# Patient Record
Sex: Male | Born: 1970 | Race: Black or African American | Hispanic: No | Marital: Single | Smoking: Current every day smoker
Health system: Southern US, Community
[De-identification: ages and names within clinical notes are randomized; demographics above are authoritative.]

## PROBLEM LIST (undated history)

## (undated) DIAGNOSIS — F329 Major depressive disorder, single episode, unspecified: Secondary | ICD-10-CM

## (undated) DIAGNOSIS — F32A Depression, unspecified: Secondary | ICD-10-CM

## (undated) DIAGNOSIS — F419 Anxiety disorder, unspecified: Secondary | ICD-10-CM

## (undated) HISTORY — PX: LEG SURGERY: SHX1003

---

## 2005-01-13 ENCOUNTER — Emergency Department (HOSPITAL_COMMUNITY): Admission: EM | Admit: 2005-01-13 | Discharge: 2005-01-14 | Payer: Self-pay | Admitting: Emergency Medicine

## 2005-03-18 ENCOUNTER — Emergency Department (HOSPITAL_COMMUNITY): Admission: EM | Admit: 2005-03-18 | Discharge: 2005-03-19 | Payer: Self-pay | Admitting: Emergency Medicine

## 2005-03-19 ENCOUNTER — Ambulatory Visit: Payer: Self-pay | Admitting: Psychiatry

## 2005-03-19 ENCOUNTER — Inpatient Hospital Stay (HOSPITAL_COMMUNITY): Admission: EM | Admit: 2005-03-19 | Discharge: 2005-03-22 | Payer: Self-pay | Admitting: Psychiatry

## 2005-03-26 ENCOUNTER — Emergency Department (HOSPITAL_COMMUNITY): Admission: EM | Admit: 2005-03-26 | Discharge: 2005-03-26 | Payer: Self-pay | Admitting: Emergency Medicine

## 2005-03-28 ENCOUNTER — Emergency Department (HOSPITAL_COMMUNITY): Admission: EM | Admit: 2005-03-28 | Discharge: 2005-03-28 | Payer: Self-pay | Admitting: Emergency Medicine

## 2005-03-30 ENCOUNTER — Emergency Department (HOSPITAL_COMMUNITY): Admission: EM | Admit: 2005-03-30 | Discharge: 2005-03-30 | Payer: Self-pay | Admitting: Emergency Medicine

## 2005-10-11 ENCOUNTER — Inpatient Hospital Stay (HOSPITAL_COMMUNITY): Admission: EM | Admit: 2005-10-11 | Discharge: 2005-10-12 | Payer: Self-pay | Admitting: Emergency Medicine

## 2005-10-11 ENCOUNTER — Encounter (INDEPENDENT_AMBULATORY_CARE_PROVIDER_SITE_OTHER): Payer: Self-pay | Admitting: Cardiovascular Disease

## 2005-10-12 ENCOUNTER — Ambulatory Visit: Payer: Self-pay | Admitting: Psychiatry

## 2005-10-12 ENCOUNTER — Inpatient Hospital Stay (HOSPITAL_COMMUNITY): Admission: RE | Admit: 2005-10-12 | Discharge: 2005-10-17 | Payer: Self-pay | Admitting: Psychiatry

## 2007-03-30 ENCOUNTER — Emergency Department (HOSPITAL_COMMUNITY): Admission: EM | Admit: 2007-03-30 | Discharge: 2007-03-30 | Payer: Self-pay | Admitting: Emergency Medicine

## 2007-08-01 ENCOUNTER — Ambulatory Visit: Payer: Self-pay | Admitting: Internal Medicine

## 2007-08-01 ENCOUNTER — Inpatient Hospital Stay (HOSPITAL_COMMUNITY): Admission: EM | Admit: 2007-08-01 | Discharge: 2007-08-02 | Payer: Self-pay | Admitting: Emergency Medicine

## 2007-08-02 ENCOUNTER — Encounter (INDEPENDENT_AMBULATORY_CARE_PROVIDER_SITE_OTHER): Payer: Self-pay | Admitting: Internal Medicine

## 2007-09-04 ENCOUNTER — Ambulatory Visit: Payer: Self-pay | Admitting: *Deleted

## 2007-09-04 ENCOUNTER — Encounter (INDEPENDENT_AMBULATORY_CARE_PROVIDER_SITE_OTHER): Payer: Self-pay | Admitting: *Deleted

## 2007-09-04 DIAGNOSIS — F17200 Nicotine dependence, unspecified, uncomplicated: Secondary | ICD-10-CM

## 2007-09-04 DIAGNOSIS — F1010 Alcohol abuse, uncomplicated: Secondary | ICD-10-CM | POA: Insufficient documentation

## 2007-09-04 DIAGNOSIS — F329 Major depressive disorder, single episode, unspecified: Secondary | ICD-10-CM | POA: Insufficient documentation

## 2007-09-04 DIAGNOSIS — R079 Chest pain, unspecified: Secondary | ICD-10-CM | POA: Insufficient documentation

## 2007-09-04 DIAGNOSIS — F1410 Cocaine abuse, uncomplicated: Secondary | ICD-10-CM | POA: Insufficient documentation

## 2007-09-04 LAB — CONVERTED CEMR LAB: TSH: 0.873 microintl units/mL (ref 0.350–4.50)

## 2007-09-12 ENCOUNTER — Telehealth (INDEPENDENT_AMBULATORY_CARE_PROVIDER_SITE_OTHER): Payer: Self-pay | Admitting: *Deleted

## 2007-09-26 ENCOUNTER — Ambulatory Visit: Payer: Self-pay | Admitting: Internal Medicine

## 2007-09-26 DIAGNOSIS — F411 Generalized anxiety disorder: Secondary | ICD-10-CM | POA: Insufficient documentation

## 2007-11-14 ENCOUNTER — Telehealth: Payer: Self-pay | Admitting: Internal Medicine

## 2007-11-29 ENCOUNTER — Emergency Department (HOSPITAL_COMMUNITY): Admission: EM | Admit: 2007-11-29 | Discharge: 2007-11-29 | Payer: Self-pay | Admitting: Emergency Medicine

## 2007-11-29 ENCOUNTER — Ambulatory Visit: Payer: Self-pay | Admitting: Psychiatry

## 2007-11-29 ENCOUNTER — Inpatient Hospital Stay (HOSPITAL_COMMUNITY): Admission: EM | Admit: 2007-11-29 | Discharge: 2007-11-29 | Payer: Self-pay | Admitting: Psychiatry

## 2007-12-23 ENCOUNTER — Encounter (INDEPENDENT_AMBULATORY_CARE_PROVIDER_SITE_OTHER): Payer: Self-pay | Admitting: *Deleted

## 2010-01-01 ENCOUNTER — Emergency Department (HOSPITAL_COMMUNITY): Admission: EM | Admit: 2010-01-01 | Discharge: 2010-01-01 | Payer: Self-pay | Admitting: Emergency Medicine

## 2010-01-04 ENCOUNTER — Emergency Department (HOSPITAL_COMMUNITY): Admission: EM | Admit: 2010-01-04 | Discharge: 2010-01-04 | Payer: Self-pay | Admitting: Emergency Medicine

## 2010-05-17 LAB — DIFFERENTIAL
Basophils Absolute: 0 10*3/uL (ref 0.0–0.1)
Basophils Relative: 0 % (ref 0–1)
Lymphocytes Relative: 27 % (ref 12–46)
Neutro Abs: 3.4 10*3/uL (ref 1.7–7.7)
Neutrophils Relative %: 60 % (ref 43–77)

## 2010-05-17 LAB — BASIC METABOLIC PANEL
BUN: 14 mg/dL (ref 6–23)
Calcium: 9.2 mg/dL (ref 8.4–10.5)
GFR calc non Af Amer: 60 mL/min — ABNORMAL LOW (ref >60)
Glucose, Bld: 98 mg/dL (ref 70–99)
Sodium: 137 mEq/L (ref 135–145)

## 2010-05-17 LAB — FECAL LACTOFERRIN, QUANT

## 2010-05-17 LAB — STOOL CULTURE

## 2010-05-17 LAB — GIARDIA/CRYPTOSPORIDIUM SCREEN(EIA): Giardia Screen - EIA: NEGATIVE

## 2010-05-17 LAB — CLOSTRIDIUM DIFFICILE EIA

## 2010-05-17 LAB — HEMOCCULT GUIAC POC 1CARD (OFFICE): Fecal Occult Bld: POSITIVE

## 2010-05-17 LAB — CBC
MCHC: 34.3 g/dL (ref 30.0–36.0)
RDW: 12.4 % (ref 11.5–15.5)
WBC: 5.7 10*3/uL (ref 4.0–10.5)

## 2010-05-18 LAB — COMPREHENSIVE METABOLIC PANEL
ALT: 24 U/L (ref 0–53)
AST: 23 U/L (ref 0–37)
CO2: 24 mEq/L (ref 19–32)
Chloride: 104 mEq/L (ref 96–112)
Creatinine, Ser: 1.21 mg/dL (ref 0.4–1.5)
GFR calc Af Amer: >60 mL/min (ref >60)
GFR calc non Af Amer: >60 mL/min (ref >60)
Sodium: 136 mEq/L (ref 135–145)
Total Bilirubin: 0.6 mg/dL (ref 0.3–1.2)

## 2010-05-18 LAB — DIFFERENTIAL
Basophils Absolute: 0 10*3/uL (ref 0.0–0.1)
Basophils Relative: 0 % (ref 0–1)
Eosinophils Absolute: 0 10*3/uL (ref 0.0–0.7)
Eosinophils Relative: 0 % (ref 0–5)

## 2010-05-18 LAB — CBC
Hemoglobin: 15.6 g/dL (ref 13.0–17.0)
MCH: 31.6 pg (ref 26.0–34.0)
RBC: 4.94 MIL/uL (ref 4.22–5.81)

## 2010-05-18 LAB — URINALYSIS, ROUTINE W REFLEX MICROSCOPIC
Bilirubin Urine: NEGATIVE
Glucose, UA: NEGATIVE mg/dL
Hgb urine dipstick: NEGATIVE
Protein, ur: 30 mg/dL — AB

## 2010-05-18 LAB — LIPASE, BLOOD: Lipase: 21 U/L (ref 11–59)

## 2010-05-18 LAB — URINE MICROSCOPIC-ADD ON

## 2010-07-22 NOTE — Discharge Summary (Signed)
NAMESILVERIO, HAGAN NO.:  [id number]   MEDICAL RECORD NO.:  [id number]          PATIENT TYPE:  IPS   LOCATION:  0501                          FACILITY:  BH   PHYSICIAN:  Geoffery Lyons, M.D.      DATE OF BIRTH:  [birth year]   DATE OF ADMISSION:  11/29/2007  DATE OF DISCHARGE:  11/29/2007                               DISCHARGE SUMMARY   CHIEF COMPLAINT AND HISTORY OF PRESENT ILLNESS:  This was the first  admission to [facility] for this 40 year old male,  initially went into the ED because he endorsed he was having suicidal  thoughts.  Has been upset recently, had a series of negative triggers,  unemployment, financial difficulties, he had a heart attack about 18  months prior to this admission, admitted to suicidal ruminations but  endorsed that he would not do it.  He was placed on a medication.  He  claims that he was told that if he has any thoughts of suicide just to  come to the ED.  He felt that everything that was going on was secondary  to the medication he took.  Nevertheless, was agreeable to checking for  observation and assessment.   SUBSTANCE ABUSE HISTORY:  No active treatment.  According to history,  did endorse prior history of substance use, cocaine starting age 23,  using every two or three months that went on for three years except  November 28, 2007 that he used 1/2 gram, alcohol starting at 11, last  used four months prior to this admission and marijuana starting at 63,  last use five months prior to this admission.   PAST PSYCHIATRIC HISTORY:  Was at [facility], [facility] and had some follow  up at [facility] and he was apparently dropped  secondary to substance abuse.   PAST MEDICAL HISTORY:  Positive for coronary artery disease, status post  MI.   MEDICATION:  He was on BuSpar 5 mg twice a day.   PHYSICAL EXAMINATION:  Failed to show any acute findings.   LABORATORY WORK:  Not available in the  chart.   MENTAL STATUS EXAM:  Alert, cooperative male who endorsed he was upset.  The feeling that he was feeling was secondary to the medication.  Was  endorsing no active suicidal or homicidal ideas.  Upon this assessment  he felt he was ready to go home.  They had some child care issues.  Endorsed he was not going to hurt himself, was willing to pursue  outpatient follow up.  No active suicidal or homicidal ideas.  No  delusions.  No hallucinations.  Cognition well-preserved.   DIAGNOSIS:  AXIS I:  1. 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  2. Depressed disorder, not otherwise specified.  AXIS II:  No diagnosis.  AXIS III:  Coronary artery disease with myocardial infarction by history.  AXIS IV:  Moderate.  AXIS V:  On admission 45-50, high global assessment of functioning in the last  year 60.   COURSE IN THE HOSPITAL:  Was admitted.  He was  insistent in going home.  He was denying any active suicidal or homicidal ideas.  He felt that it  was his response secondary to the medication he was given.  Had some  cocaine after four months abstinence, but was upsetting to him.  His  girlfriend was contacted and she was able to validate the fact that she  did not feel that he was going to be a danger to himself.  They did have  some child care issues.  He was willing to pursue outpatient treatment.  She was in agreement of letting him go from the hospital.  She had no  safety concerns.   DISCHARGE DIAGNOSIS:  AXIS I:  1. Depressive disorder, not otherwise specified.  2. 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  AXIS II:  No diagnosis.  AXIS III:  Status post myocardial infarction by history.  AXIS IV:  Moderate.  AXIS V:  Upon discharge 50-55.   Discharged to follow up in psychology clinic at [facility].      Geoffery Lyons, M.D.  Electronically Signed     IL/MEDQ  D:  12/17/2007  T:  12/18/2007  Job:  [id number]

## 2010-07-22 NOTE — Discharge Summary (Signed)
[name], CHENIER NO.:  [id number]   MEDICAL RECORD NO.:  [id number]          PATIENT TYPE:  IPS   LOCATION:  0304                          FACILITY:  BH   PHYSICIAN:  Jeanice Lim, M.D. DATE OF BIRTH:  06/30/1970   DATE OF ADMISSION:  03/19/2005  DATE OF DISCHARGE:  03/22/2005                                 DISCHARGE SUMMARY   IDENTIFYING DATA:  This is a 40 year old single African-American male  voluntarily admitted.  Presenting with a history of substance abuse, tired  of alcohol and drug use.  Out of control, using heroin, crack and alcohol  daily, 40-ounce beers 2-3.  Describing impaired sleep.  Denied IV drug use.  Had not slept in several days prior to admission.  [facility] admission.  No family psychiatric history.   ALCOHOL/DRUG HISTORY:  As above.   PRIMARY CARE PHYSICIAN:  None.   MEDICAL HISTORY:  No medical problems reported.   MEDICATIONS:  None.   ALLERGIES:  No known drug allergies.   PHYSICAL EXAMINATION:  Physical and neurologic exam performed at [facility]  and essentially within normal limits.   MENTAL STATUS EXAM:  Sleepy, oriented, in bed, poor eye contact, soft-  spoken, tired, coherent with no evidence of psychosis.  Cognitively intact.  Judgment and insight were impaired.  Poor historian.  Poor impulse control  history.   ADMISSION DIAGNOSES:  AXIS I:  Polysubstance dependence.  Rule out substance-  induced mood disorder.  AXIS II:  Deferred.  AXIS III:  None.  AXIS IV:  Moderate (problems with housing, other psychosocial issues and  sequelae of substance use).  AXIS V:  35/55.   HOSPITAL COURSE:  The patient was admitted and ordered routine p.r.n.  medications and underwent further monitoring.  Was encouraged to participate  in individual, group and milieu therapy.  Was placed on a detox protocol  with [facility].  The patient participated in dual-diagnosis therapy.  Worked on  a relapse  prevention plan.  Identifying triggers.  Reported a positive  response to medication changes and crisis interventions as well as  tolerating detox without complications.   CONDITION ON DISCHARGE:  Discharged in improved condition with no withdrawal  symptoms, no dangerous ideation.  Mood improved.  Future-oriented problem-  solving.  Improved judgment and insight.  Reporting motivation to remain  compliant with the aftercare plan, medications and abstinence.  The patient  was given medication education at the time of discharge.   DISCHARGE MEDICATIONS:  1.  Symmetrel 100 mg b.i.d. p.r.n. cravings.  2.  Trazodone 100 mg at 8 p.m.   FOLLOW UP:  The patient was to follow up with [facility] on March 31, 2005 at 12:30 p.m.   DISCHARGE DIAGNOSES:  AXIS I:  Polysubstance dependence.  Rule out substance-  induced mood disorder.  AXIS II:  Deferred.  AXIS III:  None.  AXIS IV:  Moderate (problems with housing, other psychosocial issues and  sequelae of substance use).  AXIS V:  GAF on discharge 55.  Jeanice Lim, M.D.  Electronically Signed     JEM/MEDQ  D:  04/21/2005  T:  04/22/2005  Job:  [id number]

## 2010-07-22 NOTE — Discharge Summary (Signed)
[name], [name] NO.:  [id number]   MEDICAL RECORD NO.:  [id number]          PATIENT TYPE:  INP   LOCATION:  3740                         FACILITY:  [facility]   PHYSICIAN:  Alvester Morin, M.D.  DATE OF BIRTH:  1970/07/04   DATE OF ADMISSION:  08/01/2007  DATE OF DISCHARGE:  08/02/2007                               DISCHARGE SUMMARY   DISCHARGE DIAGNOSES:  1. Chest pain related to recent cocaine abuse and likely coronary      vasospasm.  2. History of cocaine abuse.  3. History of remote heroin abuse.  4. History of alcohol abuse.  5. Two psych admissions for polysubstance abuse at [facility],      one in January 2007 and the next in August 2007.  6. History of tobacco abuse.   DISCHARGE MEDICATIONS:  None.   FOLLOWUP:  The patient will follow up in the Outpatient Clinic of  Internal Medicine at [facility].  He is to be followed up in regards to  his ability to remain sober from alcohol abuse in addition to abstaining  from cocaine and heroin abuse.  He also has to be followed up in regards  to his chest pain that he experienced secondary to his recent cocaine  use.   CONSULTATIONS:  None.   PROCEDURE PERFORMED:  1. Chest x-ray performed Aug 01, 2007, indicating chronic bronchitic      changes without any acute abnormality.  2. A 2-D echo performed Aug 02, 2007, indicating overall systolic      ejection fraction of 65%.  There are no wall motion abnormalities.   BRIEF ADMITTING HISTORY AND PHYSICAL:  This is a 37-year [place]  American male with a history of cocaine abuse, tobacco abuse, alcohol  abuse, and heroin abuse, who had a prior admission for cocaine related  chest pain and a cardiac catheterization performed 2007 that was free of  any coronary artery disease.  He comes in after having done cocaine  yesterday around 5 p.m.  He had significant chest pain, nausea,  vomiting, diaphoresis following cocaine abuse.  At that time noted that  he could exacerbate the pain both by walking and by sitting down for an  extended period of time.  He did have significant shortness of breath  with this.  As pain lasted throughout the night and into the morning, he  finally called EMS when he realized the pain would not subside on its  own.   PHYSICAL EXAMINATION:  ADMITTING VITALS:  Temperature 98.3, blood  pressure 114/77, pulse 73, respirations 18, and he is sating 98% on room  air.  GENERAL:  This is a healthy-appearing African American man in no acute  distress.  CARDIOVASCULAR:  Regular rate and rhythm.  No murmurs, rubs, or gallops.  PULMONARY:  Lungs are clear bilaterally without any wheezes or rhonchi.  ABDOMEN:  Soft, nontender, and nondistended.  Good bowel sounds.  NEUROLOGIC:  Nonfocal exam.  Cranial nerves II through XII intact.  EXTREMITIES:  Muscle strength is +5 in all extremities.  PSYCH:  Appropriate thought process and content.  No hallucinations  currently.  The patient has a normal affect and is normally interactive.   ADMITTING LABS:  Sodium 137, potassium 3.9, chloride 105, bicarb 26, BUN  12, creatinine 1.16, glucose 107, troponin 0.02, lipase 20, CK 220, and  MB 1.9.  Hemoglobin 14.1, white count 9.3, platelets 289, ANC 7.3, and  anion gap 14.   HOSPITAL COURSE:  1. Chest pain.  The patient had chest pain likely secondary to cocaine      abuse.  He likely had coronary vasospasm.  Esophageal spasm is also      a possibility.  We ruled the patient out for ACS with cardiac      enzymes x3 and EKGs were negative for any myocardial ischemia.  We      did give the patient nitroglycerin IV upon admission and I did      relieve his chest pain completely.  He also received aspirin and      Ativan for his recent cocaine abuse.  We offered him morphine      sulfate IV for chest pain as needed.  The patient realized that his      cocaine abuse was the cause of his recent chest pain.  He felt      remorse for taking  cocaine and he wants to quit using this drug.      We will try to help him abstain from cocaine abuse and see him in      the outpatient clinic to monitor his progress.  The patient has a      history of having a cardiac catheterization that was negative for      any coronary artery disease.  Therefore, we did not start any      aspirin or any other medications for him at this time.  2. Cocaine abuse.  The patient received counseling in the hospital.      He also received information from clinical social worker in regards      to resources in the [facility] community that may be able to      help him quit his drug abuse.  We will follow his progress up in      the outpatient clinic.  3. Alcohol abuse.  We offered thiamine and folate while in hospital.      We placed him on CO protocol, however, he did not withdraw while in      hospital.   DISCHARGE LABS:  TSH 0.833.   DISCHARGE VITALS:  Temperature 98.5, blood pressure 116/70, pulse 62,  respiration 18, and saturating 100% on room air.      Marcus Sails, MD  Electronically Signed      Alvester Morin, M.D.  Electronically Signed    CB/MEDQ  D:  08/07/2007  T:  08/08/2007  Job:  [id number]   cc:   [name]

## 2010-07-22 NOTE — Discharge Summary (Signed)
[name], [name] NO.:  [id number]   MEDICAL RECORD NO.:  [id number]          PATIENT TYPE:  IPS   LOCATION:  0508                          FACILITY:  [facility]   PHYSICIAN:  Geoffery Lyons, M.D.      DATE OF BIRTH:  May 09, 1970   DATE OF ADMISSION:  10/12/2005  DATE OF DISCHARGE:  10/17/2005                                 DISCHARGE SUMMARY   CHIEF COMPLAINT AND PRESENT ILLNESS:  This was the second admission to [facility] for this 40 year old African-American male  voluntarily admitted.  History of cocaine abuse.  Kicked out by girlfriend  due to his use.  Went to the ED complaining of chest pain.  Admitted.  Had a  workup that eventually turned out to be negative.  Claimed he had never  followed up after last admission.  Wanted inpatient rehab.  Reports  depression.  No suicidal or homicidal ideation.   PAST PSYCHIATRIC HISTORY:  Second time at [facility].  He was  admitted in January of 2007 for polysubstance dependence.   ALCOHOL/DRUG HISTORY:  Smokes.  Recent use of cocaine.   MEDICAL HISTORY:  Status post cardiac catheterization.   MEDICATIONS:  Norvasc 2.5 mg per day, Altace 2.5 mg per day, Prozac 10 mg  per day, trazodone 50 mg at bedtime.   PHYSICAL EXAMINATION:  Performed and failed to show any acute findings.   LABORATORY DATA:  Blood chemistry with sodium 137, potassium 5.0, glucose  120, BUN 34, creatinine 2.3, albumin 3.8.  SGOT 39, SGPT 26, bilirubin 0.7,  cholesterol 219.  Drug screen positive for cocaine.   MENTAL STATUS EXAM:  Upon admission revealed a male who was cooperative.  No  eye contact.  Speech clear, normal rate, tempo and production.  Mood tired.  Affect constricted.  Thought processes logical, coherent and relevant.  No  evidence of delusions.  No suicidal or homicidal ideation.  No  hallucinations.  Cognition was well-preserved.   ADMISSION DIAGNOSES:  AXIS I:  Depressive disorder not otherwise  specified.  Cocaine abuse.  AXIS II:  No diagnosis.  AXIS III:  Status post cardiac cath.  AXIS IV:  Moderate.  AXIS V:  GAF upon admission 35; highest GAF in the last year 60.   HOSPITAL COURSE:  He was admitted.  He was started in individual and group  psychotherapy.  He was given trazodone for sleep.  He was maintained on  Prozac 10 mg per day, Altace 2.5 mg per day, Norvasc 2.5 mg per day.  Prozac  was eventually increased to 20 mg.  We continued to increase the trazodone  until effective.  He endorsed he had been having a very hard time, having a  difficult time with cocaine.  Came to this area from [place] following his  girlfriend and their son.  He has not been able to stay abstinent.  Apparently, girlfriend gave up on him and asked him to leave.  He was  wanting to go to a longer term treatment facility.  Endorsed difficulty with  mood, depression.  We pursued  detox.  We worked on relapse prevention.  He  continued to have a hard time with depression, dealing with the cravings,  issues of shame, guilt, __________ himself as he felt he should have dealt  with the situation better.  Still wanting to go to a residential treatment  program.  Girlfriend was supportive.  Was wanting to maintain a relationship  with him if he was to seek out long-term sobriety.  We continued to work on  a relapse prevention plan.  We continued to look for residential treatment  programs but we were unable to find one.  Options were going to a shelter in  [place] and going to [facility] with hope of  getting a bed in the same residential program in the next several days or  weeks or going to a halfway house.  He was able, on October 17, 2005, to  secure a placement in a halfway house.  __________ disposition, he was going  to get himself to find a job and be able to support himself.  Meanwhile go  to outpatient treatment.   DISCHARGE DIAGNOSES:  AXIS I:  Cocaine abuse.   Depressive disorder not  otherwise specified.  AXIS II:  No diagnosis.  AXIS III:  Status post cardiac cath, arterial hypertension, rhinosinusitis.  AXIS IV:  Moderate.  AXIS V:  GAF upon discharge 50-55.   DISCHARGE MEDICATIONS:  1. Norvasc 2.5 mg per day.  2. Altace 2.5 mg per day.  3. Prozac 20 mg per day.  4. Trazodone 150 mg at bedtime.  5. Sudafed 60 mg twice a day.  6. Z-Pak.  7. __________ 600 mg twice a day.   FOLLOWUP:  To be arranged after he gets to the [facility].      Geoffery Lyons, M.D.  Electronically Signed     IL/MEDQ  D:  11/01/2005  T:  11/01/2005  Job:  [id number]

## 2010-07-22 NOTE — Discharge Summary (Signed)
[name], [name]                ACCOUNT NO.:  [id number]   MEDICAL RECORD NO.:  [id number]          PATIENT TYPE:  INP   LOCATION:  3707                         FACILITY:  [facility]   PHYSICIAN:  [name], M.D.  DATE OF BIRTH:  [birth year]   DATE OF ADMISSION:  10/10/2005  DATE OF DISCHARGE:  10/13/2005                                 DISCHARGE SUMMARY   PRINCIPAL DIAGNOSES:  1. Cocaine abuse.  2. Dehydration.  3. Suicidal ideation.  4. Abnormal electrocardiogram.  5. Abnormal cardiac enzymes.   DISCHARGE MEDICATIONS:  1. Norvasc 2.5 mg one daily.  2. Altace 2.5 mg one daily.  3. Prozac 10 mg one daily.  4. Trazodone 50 mg one at bedtime as needed.   CONDITION ON DISCHARGE:  Stable.   DISCHARGE ACTIVITIES:  As tolerated.   DISCHARGE DIET:  Low fat, low salt diet.   FOLLOW-UP:  By Dr. Jeanie Salinas (psychiatrist) as arranged.   HISTORY:  This 40 year old black male was kicked out by the girlfriend for  doing cocaine.  He came to the emergency room, was found to have abnormal  EKG and abnormal cardiac enzymes.  With a history of cocaine abuse he was  admitted.   PHYSICAL EXAMINATION:  VITAL SIGNS:  Pulse 89, respirations 17, blood  pressure 112/73, height 5 feet 6 inch, weight 175 pounds, oxygen saturation  98% on 2 L, temperature 98 degrees Fahrenheit.  GENERAL:  Patient averagely built, well-nourished.  HEENT:  Patient is normocephalic, atraumatic with brown eyes.  Pupils equal  and reactant to light.  Extraocular movement intact.  NECK:  Supple.  No JVD.  LUNGS:  Clear bilaterally.  HEART:  Normal S1, S2.  ABDOMEN:  Soft and nontender.  EXTREMITIES:  No edema, cyanosis, clubbing.  SKIN:  No rash.  CNS:  Cranial nerves grossly intact.  Patient moves all four extremities.   LABORATORY DATA:  Normal hemoglobin, hematocrit.  Normal WBC count, platelet  count.  Normal electrolytes.  BUN elevated at 34.  Creatinine elevated at  2.3.  Subsequent BUN/creatinine improved  to 21 and 1.   Chest x-ray:  No active cardiopulmonary process.   EKG:  Sinus rhythm with acute ST elevations in anterolateral leads  suggestive of acute MI.   Cardiac catheterization showed normal coronaries.   HOSPITAL COURSE:  Patient was admitted to telemetry unit due to abnormal EKG  and abnormal cardiac enzymes.  He underwent cardiac catheterization that  showed normal coronaries.  He received IV heparin.  He was placed on calcium  channel blocker, Norvasc 2.5 mg.  He was also placed on Altace 2.5 mg and he  was given IV fluids for his dehydration.  His beta blocker was discontinued  and he was discharged to [facility] in satisfactory condition  after psychiatric evaluation by Dr. Jeanie Salinas.  Since patient had expressed  suicidal ideation and help with cocaine use his need would be well served by  [facility] admission and by subsequent psychiatric follow-up  as needed.  Will be followed by me as needed also.      [name]  [name], M.D.  Electronically Signed     ASK/MEDQ  D:  10/12/2005  T:  10/12/2005  Job:  [id number]

## 2010-07-22 NOTE — Cardiovascular Report (Signed)
NAMEKIRKE, BREACH NO.:  [id number]   MEDICAL RECORD NO.:  [id number]          PATIENT TYPE:  INP   LOCATION:  1823                         FACILITY:  [facility]   PHYSICIAN:  Ricki Rodriguez, M.D.  DATE OF BIRTH:  1971/03/05   DATE OF PROCEDURE:  10/11/2005  DATE OF DISCHARGE:                              CARDIAC CATHETERIZATION   DATE OF PROCEDURE:  October 10, 2005   LOCATION:  Hospital location - emergency room.   PROCEDURE:  Left heart catheterization and selective coronary angiography.   INDICATIONS FOR PROCEDURE:  This 40 year old black male had significant  abnormality on his electrocardiogram, cardiac enzymes and cocaine use  without chest pain.   APPROACH:  Right femoral artery using #4 French sheath.   COMPLICATIONS:  None.   HEMODYNAMIC DATA:  1. The aortic pressure was 100/72 and left ventricular pressure was 97/15.  2. A left ventriculogram was not done to conserve dye use due to renal      dysfunction.  Only 35 cc of the dye was used.  3. Coronary anatomy.  The left coronary artery was short.  4. Left anterior descending coronary artery.  The left anterior descending      coronary artery was unremarkable.  5. Left circumflex coronary artery.  The left circumflex coronary artery      was unremarkable and it was a large vessel.  6. Right coronary artery.  The right coronary artery was dominant and      unremarkable.   IMPRESSION:  Normal coronaries.   RECOMMENDATIONS:  This patient will be treated medically.  He will also have  psychiatric consult or a drug/rehab consult.      Ricki Rodriguez, M.D.  Electronically Signed     ASK/MEDQ  D:  10/11/2005  T:  10/11/2005  Job:  [id number]

## 2010-07-22 NOTE — H&P (Signed)
[name], [name] NO.:  [id number]   MEDICAL RECORD NO.:  [id number]          PATIENT TYPE:  IPS   LOCATION:  0304                          FACILITY:  [facility]   PHYSICIAN:  Jeanice Lim, M.D. DATE OF BIRTH:  June 10, 1970   DATE OF ADMISSION:  03/19/2005  DATE OF DISCHARGE:  03/22/2005                         PSYCHIATRIC ADMISSION ASSESSMENT   IDENTIFYING INFORMATION:  A 40 year old single African-American male,  voluntarily admitted on March 19, 2005.   HISTORY OF PRESENT ILLNESS:  The patient presents with a history of  substance abuse.  States he is tired of the alcohol and drug use, has been  using heroin, crack and alcohol, drinking 2-3 40-ounce beers.  His last  drink was on Thursday.  Longest history of sobriety has been 2 years.  Sleeping has been decreased.  He denies any IV drug use.  He states he has  not slept in 2 days.  Denies any suicidal thoughts or psychotic symptoms.   PAST PSYCHIATRIC HISTORY:  First admission to [facility].   SOCIAL HISTORY:  He is a 40 year old single African-American male, lives  alone, considered homeless.  The patient works at the [employer],  has a 12th grade education.   FAMILY HISTORY:  None.   ALCOHOL DRUG HISTORY:  As stated above.   PAST MEDICAL HISTORY:  Primary care [facility]:  Has none.  Medical problems  none.   MEDICATIONS:  None.   DRUG ALLERGIES:  No known allergies.   PHYSICAL EXAMINATION:  The patient was assessed at [facility].  He is in no acute distress, resting at this time, in bed.  Per  nursing skin assessment, the patient has tattoos over scars in his inner  thighs.  His temperature is 97.4, 65 heart rate, 20 respirations.  Blood  pressure 110/55.  He is 136 pounds, 5 feet 6 inches tall.   LABORATORY DATA:  CBC within normal limits.  Alcohol level less than 5.  TSH  was 1.645, albumin 3.1.   MENTAL STATUS EXAM:  He is sleepy, has a sheet  over his face that he pulls  down but there is no eye contact still.  He is oriented.  Speech is soft  spoken, few answers.  Patient feels tired.  The patient feels as well very  tired and sleepy.  Thought processes are coherent, no evidence of psychosis,  cognitive function intact.  Memory is good, judgment and insight is fair,  poor impulse control, poor historian.   ADMISSION DIAGNOSES:  AXIS I:  Polysubstance dependence, rule out substance-  induced mood disorder.  AXIS II:  Deferred.  AXIS III:  None.  AXIS IV:  Other psychosocial problems, housing problems.  AXIS V:  Current is 35.   PLAN:  Plan is to detox the patient.  We will monitor withdrawal symptoms,  work on relapse prevention.  We will initiate the low-dose Librium protocol  and Clonidine protocol for opiates.  The patient is to increase coping  skills, followup with AA and NA.   TENTATIVE LENGTH OF CARE:  4 days.  Landry Corporal, N.P.      Jeanice Lim, M.D.  Electronically Signed    JO/MEDQ  D:  03/22/2005  T:  03/22/2005  Job:  [id number]

## 2010-09-07 ENCOUNTER — Emergency Department (HOSPITAL_COMMUNITY)
Admission: EM | Admit: 2010-09-07 | Discharge: 2010-09-07 | Disposition: A | Discharge status: Home / Self Care | Payer: Self-pay | Attending: Emergency Medicine | Admitting: Emergency Medicine

## 2010-09-07 DIAGNOSIS — R220 Localized swelling, mass and lump, head: Secondary | ICD-10-CM | POA: Insufficient documentation

## 2010-09-07 DIAGNOSIS — R221 Localized swelling, mass and lump, neck: Secondary | ICD-10-CM | POA: Insufficient documentation

## 2010-09-07 DIAGNOSIS — R110 Nausea: Secondary | ICD-10-CM | POA: Insufficient documentation

## 2010-09-07 DIAGNOSIS — R21 Rash and other nonspecific skin eruption: Secondary | ICD-10-CM | POA: Insufficient documentation

## 2010-09-07 DIAGNOSIS — W57XXXA Bitten or stung by nonvenomous insect and other nonvenomous arthropods, initial encounter: Secondary | ICD-10-CM | POA: Insufficient documentation

## 2010-09-07 DIAGNOSIS — T783XXA Angioneurotic edema, initial encounter: Secondary | ICD-10-CM | POA: Insufficient documentation

## 2010-09-07 DIAGNOSIS — F341 Dysthymic disorder: Secondary | ICD-10-CM | POA: Insufficient documentation

## 2010-09-07 DIAGNOSIS — L2989 Other pruritus: Secondary | ICD-10-CM | POA: Insufficient documentation

## 2010-09-07 DIAGNOSIS — L298 Other pruritus: Secondary | ICD-10-CM | POA: Insufficient documentation

## 2010-09-07 DIAGNOSIS — T148 Other injury of unspecified body region: Secondary | ICD-10-CM | POA: Insufficient documentation

## 2010-09-07 DIAGNOSIS — Z79899 Other long term (current) drug therapy: Secondary | ICD-10-CM | POA: Insufficient documentation

## 2010-11-02 ENCOUNTER — Emergency Department (HOSPITAL_COMMUNITY)
Admission: EM | Admit: 2010-11-02 | Discharge: 2010-11-03 | Discharge status: Against Medical Advice | Payer: Self-pay | Attending: Emergency Medicine | Admitting: Emergency Medicine

## 2010-11-24 LAB — I-STAT 8, (EC8 V) (CONVERTED LAB)
Bicarbonate: 26.2 — ABNORMAL HIGH
Glucose, Bld: 90
Hemoglobin: 16
Operator id: 196461
Sodium: 138
TCO2: 27

## 2010-11-24 LAB — RAPID URINE DRUG SCREEN, HOSP PERFORMED
Amphetamines: NOT DETECTED
Barbiturates: NOT DETECTED
Opiates: NOT DETECTED

## 2010-11-24 LAB — URINALYSIS, ROUTINE W REFLEX MICROSCOPIC
Hgb urine dipstick: NEGATIVE
Protein, ur: NEGATIVE
Specific Gravity, Urine: 1.02
Urobilinogen, UA: 0.2

## 2010-11-24 LAB — POCT I-STAT CREATININE: Operator id: 196461

## 2010-11-24 LAB — CBC
Platelets: 287
RDW: 12.5

## 2010-11-24 LAB — POCT CARDIAC MARKERS: CKMB, poc: 1.7

## 2010-11-24 LAB — DIFFERENTIAL
Basophils Absolute: 0
Lymphocytes Relative: 14
Neutro Abs: 7

## 2010-11-30 LAB — CBC
HCT: 40
Hemoglobin: 14.1
MCV: 91.6
RBC: 4.37
WBC: 9.3

## 2010-11-30 LAB — DIFFERENTIAL
Basophils Absolute: 0
Basophils Relative: 0
Lymphocytes Relative: 15
Monocytes Absolute: 0.7
Neutro Abs: 7.3

## 2010-11-30 LAB — CARDIAC PANEL(CRET KIN+CKTOT+MB+TROPI)
CK, MB: 1.5
CK, MB: 2.2
Relative Index: 0.8
Total CK: 191

## 2010-11-30 LAB — URINE CULTURE: Colony Count: 3000

## 2010-11-30 LAB — RAPID URINE DRUG SCREEN, HOSP PERFORMED
Barbiturates: NOT DETECTED
Opiates: NOT DETECTED

## 2010-11-30 LAB — PROTIME-INR: Prothrombin Time: 13.4

## 2010-11-30 LAB — COMPREHENSIVE METABOLIC PANEL
Albumin: 4.2
Alkaline Phosphatase: 57
BUN: 11
CO2: 21
Chloride: 100
Creatinine, Ser: 1.09
GFR calc non Af Amer: >60
Glucose, Bld: 93
Potassium: 3.4 — ABNORMAL LOW
Total Bilirubin: 0.6

## 2010-11-30 LAB — URINALYSIS, ROUTINE W REFLEX MICROSCOPIC
Bilirubin Urine: NEGATIVE
Hgb urine dipstick: NEGATIVE
Ketones, ur: 15 — AB
Specific Gravity, Urine: 1.01
Urobilinogen, UA: 0.2
pH: 7

## 2010-11-30 LAB — APTT: aPTT: 28

## 2010-11-30 LAB — BASIC METABOLIC PANEL
BUN: 12
Calcium: 8.9
Creatinine, Ser: 1.16
GFR calc non Af Amer: >60
Glucose, Bld: 107 — ABNORMAL HIGH

## 2010-11-30 LAB — POCT CARDIAC MARKERS
Myoglobin, poc: 164
Myoglobin, poc: 92.3
Operator id: 114141
Operator id: 282201
Troponin i, poc: <0.05
Troponin i, poc: <0.05

## 2010-11-30 LAB — LIPASE, BLOOD: Lipase: 20

## 2010-11-30 LAB — CK TOTAL AND CKMB (NOT AT ARMC)
CK, MB: 1.9
Relative Index: 0.9
Total CK: 221

## 2010-11-30 LAB — ETHANOL: Alcohol, Ethyl (B): <5

## 2010-12-05 LAB — ETHANOL: Alcohol, Ethyl (B): <5

## 2010-12-05 LAB — URINALYSIS, ROUTINE W REFLEX MICROSCOPIC
Bilirubin Urine: NEGATIVE
Glucose, UA: NEGATIVE
Hgb urine dipstick: NEGATIVE
Ketones, ur: 15 — AB
pH: 5.5

## 2010-12-05 LAB — CBC
HCT: 41.6
Hemoglobin: 14.1
WBC: 10

## 2010-12-05 LAB — POCT I-STAT, CHEM 8
BUN: 18
Chloride: 109
Glucose, Bld: 100 — ABNORMAL HIGH
HCT: 43
Potassium: 4.1

## 2010-12-05 LAB — RAPID URINE DRUG SCREEN, HOSP PERFORMED
Opiates: NOT DETECTED
Tetrahydrocannabinol: NOT DETECTED

## 2010-12-05 LAB — DIFFERENTIAL
Eosinophils Relative: 0
Lymphocytes Relative: 15
Lymphs Abs: 1.5
Monocytes Absolute: 0.8

## 2011-02-20 ENCOUNTER — Emergency Department (HOSPITAL_COMMUNITY)
Admission: EM | Admit: 2011-02-20 | Discharge: 2011-02-21 | Discharge status: Against Medical Advice | Payer: Self-pay | Attending: Emergency Medicine | Admitting: Emergency Medicine

## 2011-02-20 ENCOUNTER — Encounter: Payer: Self-pay | Admitting: Emergency Medicine

## 2011-02-20 DIAGNOSIS — R059 Cough, unspecified: Secondary | ICD-10-CM | POA: Insufficient documentation

## 2011-02-20 DIAGNOSIS — R05 Cough: Secondary | ICD-10-CM | POA: Insufficient documentation

## 2011-02-20 HISTORY — DX: Anxiety disorder, unspecified: F41.9

## 2011-02-20 HISTORY — DX: Depression, unspecified: F32.A

## 2011-02-20 HISTORY — DX: Major depressive disorder, single episode, unspecified: F32.9

## 2011-02-20 NOTE — ED Notes (Signed)
PT. REPORTS PRODUCTIVE COUGH ONSET YESTERDAY , DIZZY , SWEATY.

## 2011-02-21 NOTE — ED Notes (Signed)
No answer in all waiting rooms 

## 2011-09-09 IMAGING — CT CT ABD-PELV W/ CM
2 of 4 series · 16 of 46 positions shown, 18 images · IV contrast (agent unspecified)
Comparison: None.

CLINICAL DATA: 38-year-old male with right lower quadrant
abdominal pain.  The patient reports taking Pepto-Bismol recently,
but no other CT scans.

CT ABDOMEN AND PELVIS WITH CONTRAST
TECHNIQUE: Multidetector CT imaging of the abdomen and pelvis was
performed using the standard protocol following bolus
administration of intravenous contrast.
Contrast: 80 ml Ymnipaque-VJJ.

[Series 2: abd/pelv with 5.0 b31f st · axial · 0.71mm/px · z∈[+918,+1313]mm · 13 of 87 slices shown, 15 images]
[im 4/87  soft-tissue]
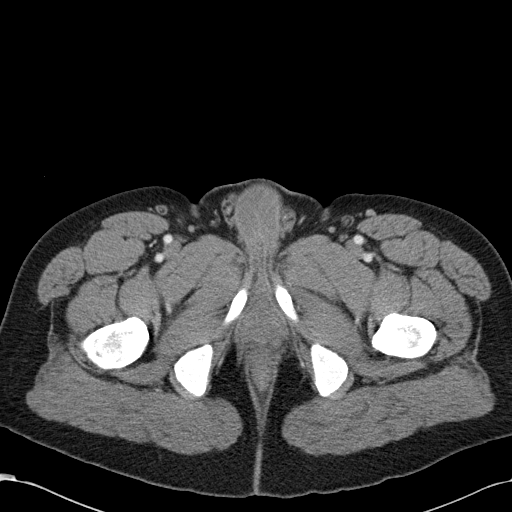
[im 4/87  bone]
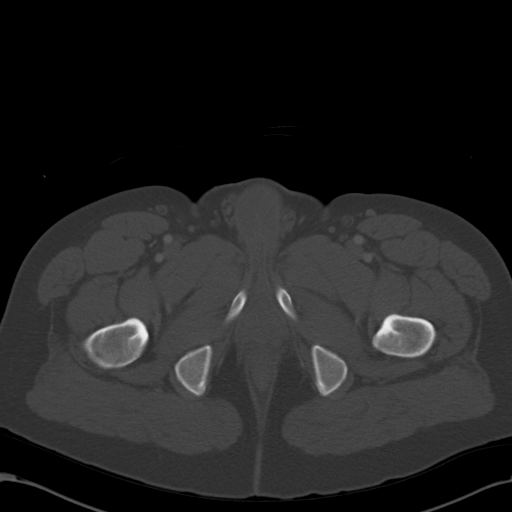
[im 11/87  soft-tissue]
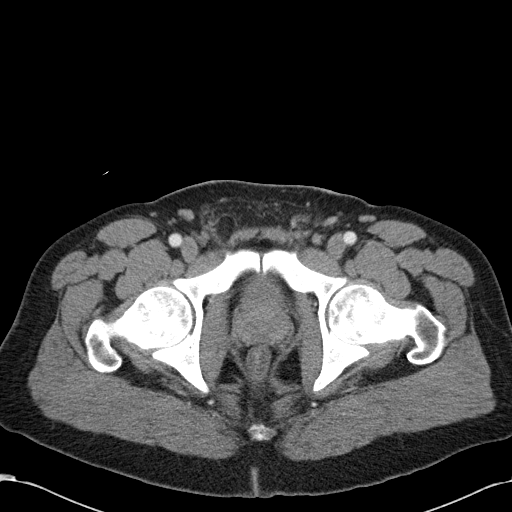
[im 18/87  soft-tissue]
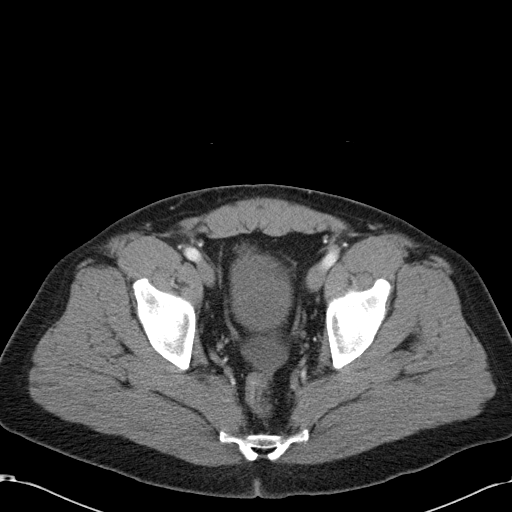
[im 26/87  soft-tissue]
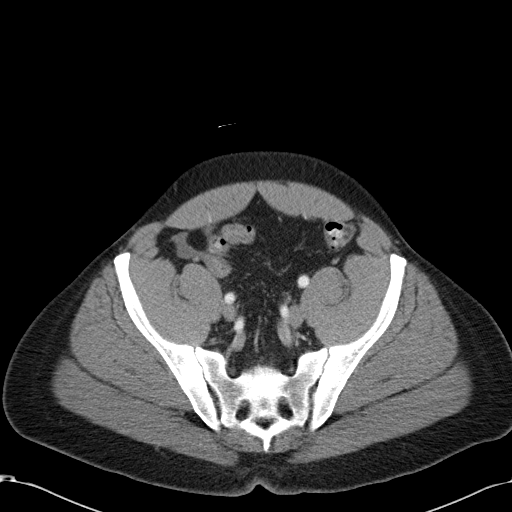
[im 29/87  soft-tissue]
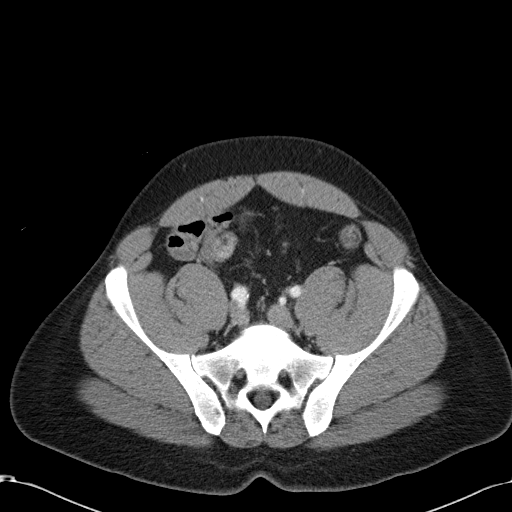
[im 36/87  soft-tissue]
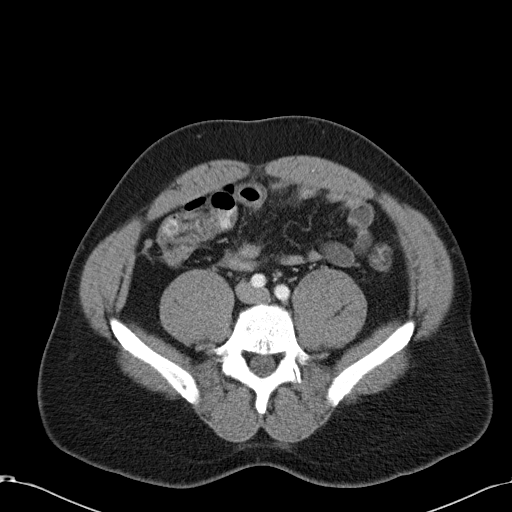
[im 44/87  soft-tissue]
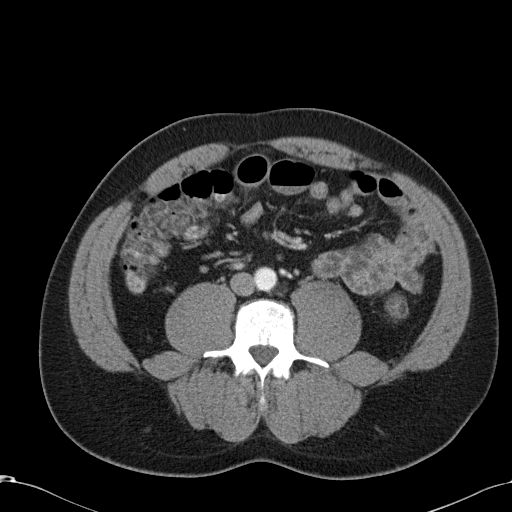
[im 51/87  soft-tissue]
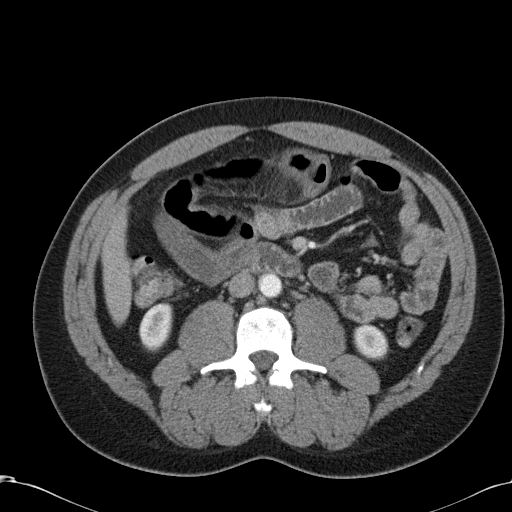
[im 58/87  soft-tissue]
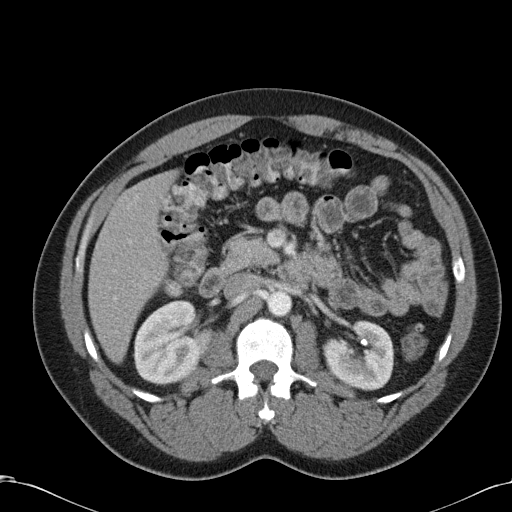
[im 58/87  bone]
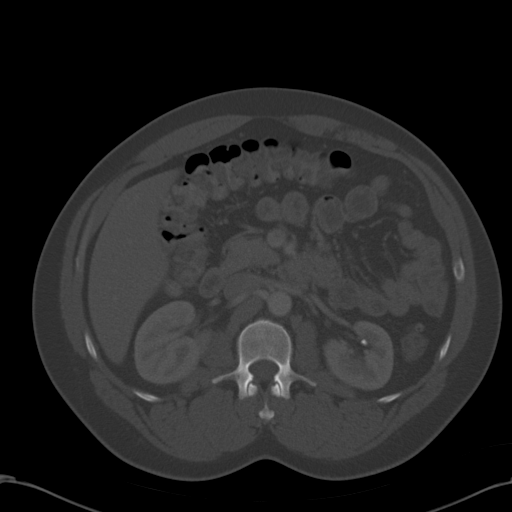
[im 61/87  soft-tissue]
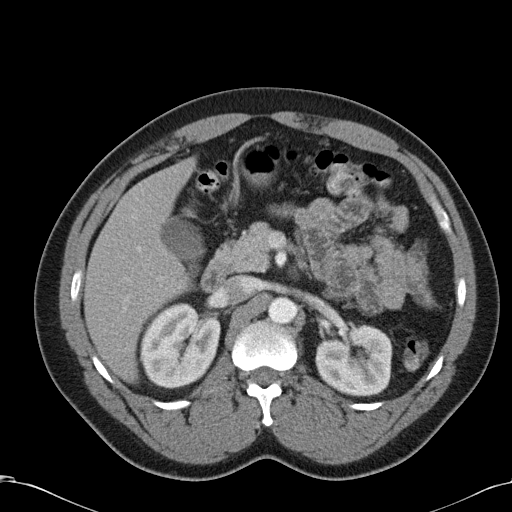
[im 69/87  soft-tissue]
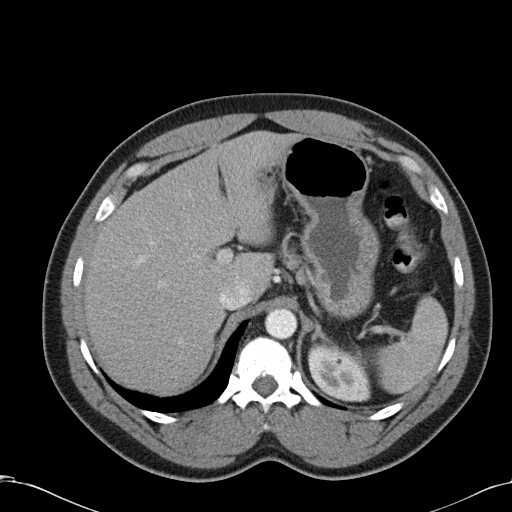
[im 76/87  soft-tissue]
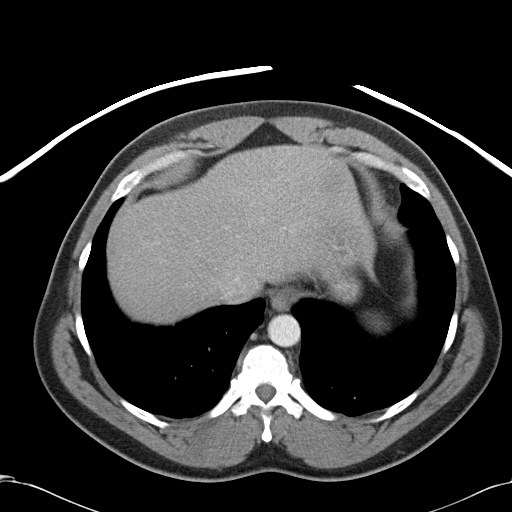
[im 83/87  soft-tissue]
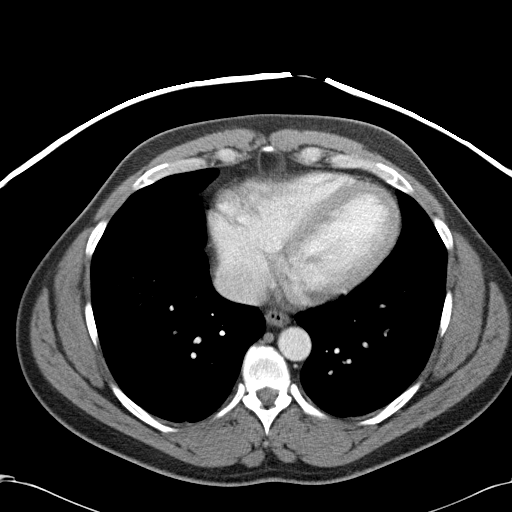

[Series 603: cor · coronal · 0.84mm/px · 3 of 80 slices shown]
[im 27/80  soft-tissue]
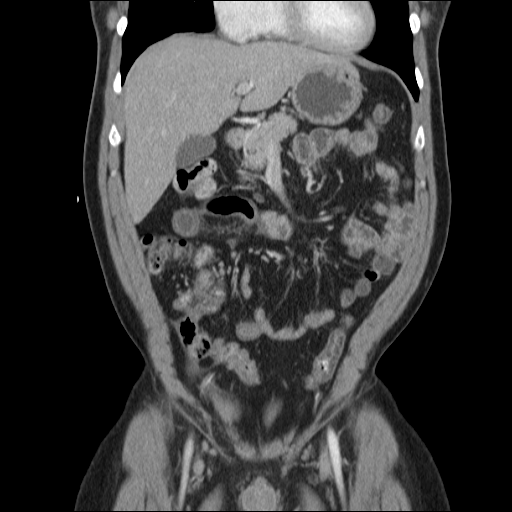
[im 36/80  soft-tissue]
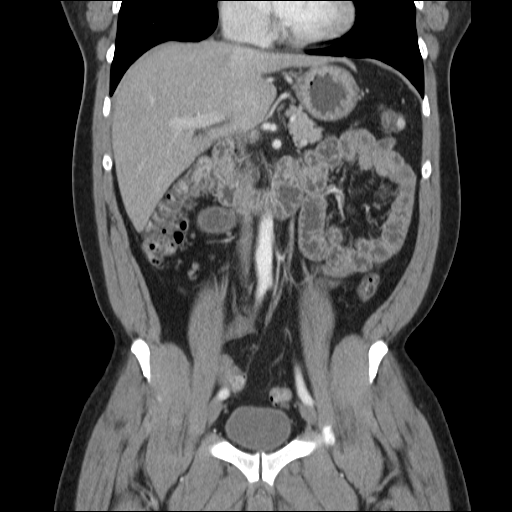
[im 44/80  soft-tissue]
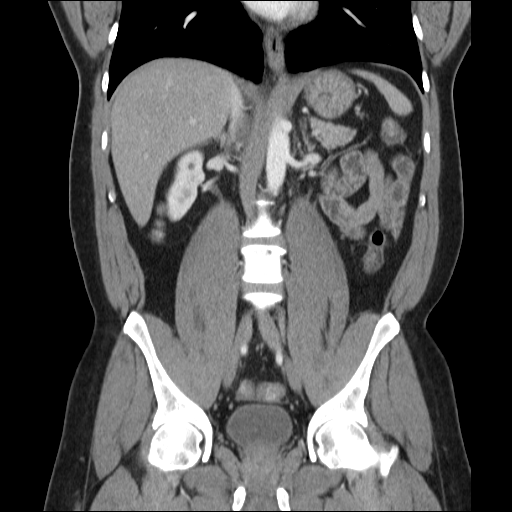

[16 of 46 positions shown; findings below may reference images not displayed]

FINDINGS: Minor bibasilar atelectasis. No acute osseous
abnormality identified.  Bladder, liver, gallbladder, spleen,
pancreas, adrenal glands, portal venous system, and major arterial
structures are within normal limits.  The left kidney is normal
aside from a nonobstructing 4 mm mid pole calculus.  There is a sub
centimeter low density area at the right anterior mid pole cortex
which is too small to characterize.  No lymphadenopathy.  The
stomach, duodenum, and proximal small bowel loops are within normal
limits.

There is an inflamed loop of small bowel in the mid right abdomen
(series 2 image 39, coronal image 23) with wall thickening and
stranding in the mesentery.  Other distal small bowel loops are
decompressed.  The terminal ileum appears within normal limits.
There is some high density throughout the colon compatible with CT
type oral contrast.  The appendix is nondilated and the walls
appear non thickened.  There is a punctate area of hyperdensity in
the hip.  There is mild stranding of the right lateral coronal
fascia and small volume of right lower quadrant and pelvic free
fluid.  There are diverticula noted intermittently throughout much
of the colon.  No  focal colonic wall thickening or mesenteric
stranding.
IMPRESSION: 1.  Small volume of free fluid in the right lower quadrant and
pelvis appears most likely related to and inflamed distal small
bowel loop in the right midabdomen, series 2 image 39.  This is
compatible with acute nonspecific enteritis could reflect infection
or inflammatory disease.
2.  No strong evidence of acute appendicitis at this time.
Scattered colonic diverticula.
3.  Nonobstructing left renal nonobstructing left nephrolithiasis 4
mm.
